# Patient Record
Sex: Female | Born: 1990 | Race: White | Hispanic: No | Marital: Single | Smoking: Never smoker
Health system: Southern US, Community
[De-identification: ages and names within clinical notes are randomized; demographics above are authoritative.]

## PROBLEM LIST (undated history)

## (undated) DIAGNOSIS — T7840XA Allergy, unspecified, initial encounter: Secondary | ICD-10-CM

## (undated) DIAGNOSIS — J45909 Unspecified asthma, uncomplicated: Secondary | ICD-10-CM

## (undated) HISTORY — PX: FRACTURE SURGERY: SHX138

## (undated) HISTORY — DX: Allergy, unspecified, initial encounter: T78.40XA

## (undated) HISTORY — PX: SHOULDER SURGERY: SHX246

## (undated) HISTORY — PX: WRIST SURGERY: SHX841

---
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
## 2011-04-14 ENCOUNTER — Ambulatory Visit: Payer: Self-pay | Admitting: Women's Health

## 2011-10-08 ENCOUNTER — Ambulatory Visit: Payer: BC Managed Care – PPO

## 2011-10-08 ENCOUNTER — Ambulatory Visit (INDEPENDENT_AMBULATORY_CARE_PROVIDER_SITE_OTHER): Payer: BC Managed Care – PPO | Admitting: Family Medicine

## 2011-10-08 VITALS — BP 113/72 | HR 60 | Temp 97.7°F | Resp 16 | Ht 71.0 in | Wt 198.8 lb

## 2011-10-08 DIAGNOSIS — M25579 Pain in unspecified ankle and joints of unspecified foot: Secondary | ICD-10-CM

## 2011-10-08 DIAGNOSIS — Z309 Encounter for contraceptive management, unspecified: Secondary | ICD-10-CM

## 2011-10-08 DIAGNOSIS — J45990 Exercise induced bronchospasm: Secondary | ICD-10-CM

## 2011-10-08 DIAGNOSIS — Z23 Encounter for immunization: Secondary | ICD-10-CM

## 2011-10-08 DIAGNOSIS — M25569 Pain in unspecified knee: Secondary | ICD-10-CM

## 2011-10-08 DIAGNOSIS — IMO0001 Reserved for inherently not codable concepts without codable children: Secondary | ICD-10-CM

## 2011-10-08 DIAGNOSIS — Z9189 Other specified personal risk factors, not elsewhere classified: Secondary | ICD-10-CM

## 2011-10-08 MED ORDER — ALBUTEROL SULFATE HFA 108 (90 BASE) MCG/ACT IN AERS: 2.0000 | INHALATION_SPRAY | Freq: Four times a day (QID) | RESPIRATORY_TRACT | Status: DC | PRN

## 2011-10-08 MED ORDER — NORGESTIMATE-ETH ESTRADIOL 0.25-35 MG-MCG PO TABS: 1.0000 | ORAL_TABLET | Freq: Every day | ORAL | Status: DC

## 2011-10-08 NOTE — Progress Notes (Signed)
Patient Name: Chelsey Moore Date of Birth: August 22, 1990 Medical Record Number: [id number] Gender: female Date of Encounter: 10/08/2011  History of Present Illness:  Chelsey Moore is a 21 y.o. very pleasant female patient who presents with the following few concerns:   She has been on Sprintec OCP, but her rx was changed to another brand due to insurance change.  She is to start her next pack tomorrow- had menses last week.  She really preferred the Sprintec and would like to go back on this. She would like to do 3 months rx at a time.    Needs a Tdap today as well.  She needs this for summer school at [employer].  She attends [employer] during the school year. Td done in 2006  Chelsey Moore would like to maybe get an inhaler for EIA. She notes that after doing sprints at volleyball practice she sometimes will wheeze.  These symptoms have been worse in the spring.  She does not otherwise have asthma or RAD.    She also has noted some tendonitis in her left knee, and has been diagnosed with loose patella and patella alta.  She had a steroid injection in the past at school, and has seen [facility] in the past as well.  She wears a knee sleeve by [facility].  She is working with her trainer at school and doing some rehab for her knee- and also for her ankle, see below.   Chelsey Moore severely sprained her right ankle about 2 months ago, and it has not yet gotten back to normal.  She is wearing a lace- up support.  The ankle is better but she is still not without pain.    Chelsey Moore plans to train hard all summer in order to be in shape for volleyball in the fall at school.    There is no problem list on file for this patient.  No past medical history on file. No past surgical history on file. History  Substance Use Topics  . Smoking status: Never Smoker   . Smokeless tobacco: Not on file  . Alcohol Use: Not on file   No family history on file. Allergies  Allergen Reactions  . Augmentin (Amoxicillin-Pot  Clavulanate) Diarrhea    Medication list has been reviewed and updated.  Prior to Admission medications   Medication Sig Start Date End Date Taking? Authorizing Provider  norgestimate-ethinyl estradiol (ORTHO-CYCLEN,SPRINTEC,PREVIFEM) 0.25-35 MG-MCG tablet Take 1 tablet by mouth daily.   Yes Historical Provider, MD    Review of Systems:  As per HPI- otherwise negative.   Physical Examination: Filed Vitals:   10/08/11 0950  BP: 113/72  Pulse: 60  Temp: 97.7 F (36.5 C)  Resp: 16   Filed Vitals:   10/08/11 0950  Height: 5\' 11"  (1.803 m)  Weight: 198 lb 12.8 oz (90.175 kg)   Body mass index is 27.73 kg/(m^2). Ideal Body Weight: Weight in (lb) to have BMI = 25: 178.9   GEN: WDWN, NAD, Non-toxic, A & O x 3 HEENT: Atraumatic, Normocephalic. Neck supple. No masses, No LAD. Ears and Nose: No external deformity. CV: RRR, No M/G/R. No JVD. No thrill. No extra heart sounds. PULM: CTA B, no wheezes, crackles, rhonchi. No retractions. No resp. distress. No accessory muscle use. EXTR: No c/c/e NEURO Normal gait.  PSYCH: Normally interactive. Conversant. Not depressed or anxious appearing.  Calm demeanor.  Right ankle:  Minimal tenderness over lateral malleolus, no bruising or swelling.  Normal ROM Left  knee: mild swelling, minimal if any effusion.  No crepitus, patellar ligament is somewhat lax but other ligaments stable.   [facility] reading (PRIMARY) by  Dr. Patsy Lager.  Negative left ankle  RIGHT ANKLE - COMPLETE 3+ VIEW  Comparison: No priors.  Findings: Three views the right ankle demonstrate no acute fracture, subluxation, dislocation, joint or soft tissue abnormality.  IMPRESSION: 1. No acute radiographic abnormality of the right ankle.  Assessment and Plan: 1. Scheduled immunizations not up to date  Tdap vaccine greater than or equal to 7yo IM  2. Pain, ankle  DG Ankle Complete Right  3. Pain, knee    4. Contraception  norgestimate-ethinyl estradiol (SPRINTEC 28) 0.25-35  MG-MCG tablet  5. Exercise-induced asthma  albuterol (PROVENTIL HFA;VENTOLIN HFA) 108 (90 BASE) MCG/ACT inhaler  gave rx for sprintec brand OCP which they plan to fill locally.  Update Tdap, gave albuterol inhaler for PRN use of EIA- discussed how and when to use it.    I am concerned that [name] is not going to give her knee and ankle the rest that they need to heal.  She is aware that ideally she would rest these joints until they are well, but she is not able to do this given her volleyball requirements.  I therefore recommended that they follow- up with [facility] who has helped them in the past.  A regimen of PT may be helpful in getting her through these injuries.  She plans to see a trusted doctor in [facility] [facility] first, but will see [facility] if she needs other help.    Abbe Amsterdam, MD

## 2011-10-17 ENCOUNTER — Ambulatory Visit (INDEPENDENT_AMBULATORY_CARE_PROVIDER_SITE_OTHER): Payer: BC Managed Care – PPO | Admitting: Family Medicine

## 2011-10-17 VITALS — BP 115/59 | HR 49 | Temp 98.5°F | Resp 16 | Ht 69.5 in | Wt 198.0 lb

## 2011-10-17 DIAGNOSIS — L03119 Cellulitis of unspecified part of limb: Secondary | ICD-10-CM

## 2011-10-17 DIAGNOSIS — L0390 Cellulitis, unspecified: Secondary | ICD-10-CM

## 2011-10-17 DIAGNOSIS — L02419 Cutaneous abscess of limb, unspecified: Secondary | ICD-10-CM

## 2011-10-17 MED ORDER — SULFAMETHOXAZOLE-TRIMETHOPRIM 800-160 MG PO TABS: 1.0000 | ORAL_TABLET | Freq: Two times a day (BID) | ORAL | Status: AC

## 2011-10-17 NOTE — Progress Notes (Signed)
Patient ID: NEDDA GAINS, female   DOB: August 08, 1990, 21 y.o.   MRN: [id number] TAVONNA WORTHINGTON is a 21 y.o. female who presents to Urgent Care today for redness and pain in Right inner thigh:  1.  Redness and pain:  Present x 3 days.  Notices when walking and playing volleyball.  No drainage.  No fevers or chills.  No nausea or vomiting.    Has had similar symptoms before and told it was MRSA infection and had it drained, but previously it was more painful.     PMH reviewed.  ROS as above otherwise neg Medications reviewed. Current Outpatient Prescriptions  Medication Sig Dispense Refill  . albuterol (PROVENTIL HFA;VENTOLIN HFA) 108 (90 BASE) MCG/ACT inhaler Inhale 2 puffs into the lungs every 6 (six) hours as needed for wheezing.  1 Inhaler  6  . norgestimate-ethinyl estradiol (SPRINTEC 28) 0.25-35 MG-MCG tablet Take 1 tablet by mouth daily.  3 Package  3    Exam:  BP 115/59  Pulse 49  Temp 98.5 F (36.9 C) (Oral)  Resp 16  Ht 5' 9.5" (1.765 m)  Wt 198 lb (89.812 kg)  BMI 28.82 kg/m2  LMP 10/08/2011 Gen: Well NAD Lungs: CTABL Nl WOB Heart: RRR no MRG Abd: NABS, NT, ND Exts: 1 x 2 cm area of cellulitis noted inner aspect of Right thigh.  No areas of fluctuance present.  There is an area of about 0.5 cm induration in very center of cellulitic skin.  Also small sinus present, unable to express any fluid when squeezed.    Nurse was present for all aspects of examination.  Assessment and Plan:  1.  Cellulitis:  Bactrim DS x 10 days.  No areas of fluctuance, nothing to drain at this time.  Instructed on warm compresses to bring to head so that we can drain it.  Return for recheck in 48 hours and hopeful to drain at that time, or possibly resolve with PO antibiotics.  FU sooner if she has worsening

## 2011-10-17 NOTE — Patient Instructions (Addendum)
Take the antibiotic twice daily for the next 10 days.   This should clear things up. If this doesn't get any better after 48 hours with the antibiotics, you should come in and see [place] at that point.   Use a warm compress several times a day for about 15 minutes at a time time to help bring this to a head. If you notice that the redness is spreading or you have any worsening of the pain, fevers, chills, vomiting come back soon.

## 2011-11-10 ENCOUNTER — Ambulatory Visit: Payer: BC Managed Care – PPO

## 2011-11-10 ENCOUNTER — Ambulatory Visit (INDEPENDENT_AMBULATORY_CARE_PROVIDER_SITE_OTHER): Payer: BC Managed Care – PPO | Admitting: Emergency Medicine

## 2011-11-10 VITALS — BP 118/74 | HR 62 | Temp 98.7°F | Resp 16 | Ht 70.5 in | Wt 195.0 lb

## 2011-11-10 DIAGNOSIS — S63509A Unspecified sprain of unspecified wrist, initial encounter: Secondary | ICD-10-CM

## 2011-11-10 DIAGNOSIS — M25539 Pain in unspecified wrist: Secondary | ICD-10-CM

## 2011-11-10 NOTE — Progress Notes (Signed)
  Date:  11/10/2011   Name:  WHITNI PASQUINI   DOB:  1991/03/13   MRN:  [id number]  PCP:  No primary provider on file.    Chief Complaint: Wrist Pain   History of Present Illness:  Chelsey Moore is a 21 y.o. very pleasant female patient who presents with the following:  Injured left wrist while playing volleyball last week.  Has pain in ulnar aspect of wrist.  Interferes with pronation and supination and flexion of wrist.  Denies other complaints  There is no problem list on file for this patient.   No past medical history on file.  No past surgical history on file.  History  Substance Use Topics  . Smoking status: Never Smoker   . Smokeless tobacco: Not on file  . Alcohol Use: Not on file    No family history on file.  Allergies  Allergen Reactions  . Augmentin (Amoxicillin-Pot Clavulanate) Diarrhea    Medication list has been reviewed and updated.  Current Outpatient Prescriptions on File Prior to Visit  Medication Sig Dispense Refill  . albuterol (PROVENTIL HFA;VENTOLIN HFA) 108 (90 BASE) MCG/ACT inhaler Inhale 2 puffs into the lungs every 6 (six) hours as needed for wheezing.  1 Inhaler  6  . norgestimate-ethinyl estradiol (SPRINTEC 28) 0.25-35 MG-MCG tablet Take 1 tablet by mouth daily.  3 Package  3    Review of Systems:  As per HPI, otherwise negative.   Physical Examination: Filed Vitals:   11/10/11 1748  BP: 118/74  Pulse: 62  Temp: 98.7 F (37.1 C)  Resp: 16   Filed Vitals:   11/10/11 1748  Height: 5' 10.5" (1.791 m)  Weight: 195 lb (88.451 kg)   Body mass index is 27.58 kg/(m^2). Ideal Body Weight: Weight in (lb) to have BMI = 25: 176.4    GEN: WDWN, NAD, Non-toxic, Alert & Oriented x 3 HEENT: Atraumatic, Normocephalic.  Ears and Nose: No external deformity. EXTR: No clubbing/cyanosis/edema NEURO: Normal gait.  PSYCH: Normally interactive. Conversant. Not depressed or anxious appearing.  Calm demeanor.  Wrist tender and guards  left wrist.  Assessment and Plan: Wrist sprain Splint Aleve Ice Rest  Carmelina Dane, MD

## 2011-11-10 NOTE — Progress Notes (Signed)
  Subjective:    Patient ID: Chelsey Moore, female    DOB: July 28, 1990, 21 y.o.   MRN: [id number]  HPI    Review of Systems     Objective:   Physical Exam        Assessment & Plan:  [facility] reading (PRIMARY) by  Dr. Dareen Piano.  Negative for new injury.  Evidence prior fracture.

## 2011-11-11 NOTE — Progress Notes (Signed)
  Subjective:    Patient ID: Chelsey Moore, female    DOB: Dec 07, 1990, 21 y.o.   MRN: [id number]  HPI    Review of Systems     Objective:   Physical Exam        Assessment & Plan:  [facility] reading (PRIMARY) by  Dr. Dareen Piano.  negative.

## 2012-04-20 ENCOUNTER — Encounter (HOSPITAL_BASED_OUTPATIENT_CLINIC_OR_DEPARTMENT_OTHER): Payer: Self-pay | Admitting: Emergency Medicine

## 2012-04-20 ENCOUNTER — Emergency Department (HOSPITAL_BASED_OUTPATIENT_CLINIC_OR_DEPARTMENT_OTHER)
Admission: EM | Admit: 2012-04-20 | Discharge: 2012-04-20 | Disposition: A | Discharge status: Home / Self Care | Payer: BC Managed Care – PPO | Attending: Emergency Medicine | Admitting: Emergency Medicine

## 2012-04-20 ENCOUNTER — Encounter (HOSPITAL_BASED_OUTPATIENT_CLINIC_OR_DEPARTMENT_OTHER): Payer: Self-pay | Admitting: *Deleted

## 2012-04-20 DIAGNOSIS — W64XXXA Exposure to other animate mechanical forces, initial encounter: Secondary | ICD-10-CM | POA: Insufficient documentation

## 2012-04-20 DIAGNOSIS — J45909 Unspecified asthma, uncomplicated: Secondary | ICD-10-CM | POA: Insufficient documentation

## 2012-04-20 DIAGNOSIS — Y929 Unspecified place or not applicable: Secondary | ICD-10-CM | POA: Insufficient documentation

## 2012-04-20 DIAGNOSIS — Z203 Contact with and (suspected) exposure to rabies: Secondary | ICD-10-CM | POA: Insufficient documentation

## 2012-04-20 DIAGNOSIS — Z79899 Other long term (current) drug therapy: Secondary | ICD-10-CM | POA: Insufficient documentation

## 2012-04-20 DIAGNOSIS — Z23 Encounter for immunization: Secondary | ICD-10-CM | POA: Insufficient documentation

## 2012-04-20 DIAGNOSIS — Y939 Activity, unspecified: Secondary | ICD-10-CM | POA: Insufficient documentation

## 2012-04-20 HISTORY — DX: Unspecified asthma, uncomplicated: J45.909

## 2012-04-20 MED ORDER — RABIES IMMUNE GLOBULIN 150 UNIT/ML IM INJ
INJECTION | INTRAMUSCULAR | Status: AC
  Administered 2012-04-20: 1800 [IU] via INTRAMUSCULAR
  Filled 2012-04-20: qty 4

## 2012-04-20 MED ORDER — RABIES IMMUNE GLOBULIN 150 UNIT/ML IM INJ
20.0000 [IU]/kg | INJECTION | Freq: Once | INTRAMUSCULAR | Status: AC
  Administered 2012-04-20: 1800 [IU] via INTRAMUSCULAR

## 2012-04-20 MED ORDER — RABIES VACCINE, PCEC IM SUSR
1.0000 mL | Freq: Once | INTRAMUSCULAR | Status: AC
  Administered 2012-04-20: 1 mL via INTRAMUSCULAR

## 2012-04-20 NOTE — ED Notes (Signed)
Exposure to a live bat found in their house.  No active bite or exposure noted.

## 2012-04-20 NOTE — ED Notes (Signed)
Possible bat bite, it was found in the house

## 2012-04-20 NOTE — ED Provider Notes (Addendum)
History     CSN: [id number]  Arrival date & time 04/20/12  1010   First MD Initiated Contact with Patient 04/20/12 1055      Chief Complaint  Patient presents with  . Rabies Injection    (Consider location/radiation/quality/duration/timing/severity/associated sxs/prior treatment) HPI Pt reports there was a bat in their house for several days earlier in the week. No known bites. Pt here with several family members to begin Rabies series. She is asymptomatic.  Past Medical History  Diagnosis Date  . Asthma     Past Surgical History  Procedure Date  . Wrist surgery     left    No family history on file.  History  Substance Use Topics  . Smoking status: Never Smoker   . Smokeless tobacco: Not on file  . Alcohol Use: Yes    OB History    Grav Para Term Preterm Abortions TAB SAB Ect Mult Living                  Review of Systems  All other systems reviewed and are negative.    Allergies  Augmentin  Home Medications   Current Outpatient Rx  Name  Route  Sig  Dispense  Refill  . ALBUTEROL SULFATE HFA 108 (90 BASE) MCG/ACT IN AERS   Inhalation   Inhale 2 puffs into the lungs every 6 (six) hours as needed for wheezing.   1 Inhaler   6   . NORGESTIMATE-ETH ESTRADIOL 0.25-35 MG-MCG PO TABS   Oral   Take 1 tablet by mouth daily.   3 Package   3     Patient prefers the Sprintec brand please     BP 128/71  Pulse 63  Temp 98.4 F (36.9 C) (Oral)  Resp 18  SpO2 100%  Physical Exam  Constitutional: She is oriented to person, place, and time. She appears well-developed and well-nourished.  HENT:  Head: Normocephalic and atraumatic.  Neck: Neck supple.  Pulmonary/Chest: Effort normal.  Neurological: She is alert and oriented to person, place, and time. No cranial nerve deficit.  Psychiatric: She has a normal mood and affect. Her behavior is normal.    ED Course  Procedures (including critical care time)  Labs Reviewed - No data to display No  results found.   No diagnosis found.    MDM  Rabies vaccination series ordered.      Zedekiah Hinderman B. Bernette Mayers, MD 04/20/12 1105

## 2012-04-23 ENCOUNTER — Encounter (HOSPITAL_COMMUNITY): Payer: Self-pay | Admitting: *Deleted

## 2012-04-23 ENCOUNTER — Emergency Department (INDEPENDENT_AMBULATORY_CARE_PROVIDER_SITE_OTHER)
Admission: EM | Admit: 2012-04-23 | Discharge: 2012-04-23 | Disposition: A | Discharge status: Home / Self Care | Payer: BC Managed Care – PPO | Source: Home / Self Care

## 2012-04-23 DIAGNOSIS — Z23 Encounter for immunization: Secondary | ICD-10-CM

## 2012-04-23 MED ORDER — RABIES VACCINE, PCEC IM SUSR
1.0000 mL | Freq: Once | INTRAMUSCULAR | Status: AC
  Administered 2012-04-23: 1 mL via INTRAMUSCULAR

## 2012-04-23 MED ORDER — RABIES VACCINE, PCEC IM SUSR
INTRAMUSCULAR | Status: AC
  Filled 2012-04-23: qty 1

## 2012-04-23 NOTE — ED Notes (Signed)
Pt  Here  Today  For  Day 3  Rabies

## 2012-04-27 ENCOUNTER — Emergency Department (INDEPENDENT_AMBULATORY_CARE_PROVIDER_SITE_OTHER)
Admission: EM | Admit: 2012-04-27 | Discharge: 2012-04-27 | Disposition: A | Discharge status: Home / Self Care | Payer: BC Managed Care – PPO | Source: Home / Self Care

## 2012-04-27 ENCOUNTER — Encounter (HOSPITAL_COMMUNITY): Payer: Self-pay | Admitting: *Deleted

## 2012-04-27 DIAGNOSIS — Z23 Encounter for immunization: Secondary | ICD-10-CM

## 2012-04-27 MED ORDER — RABIES VACCINE, PCEC IM SUSR
INTRAMUSCULAR | Status: AC
  Filled 2012-04-27: qty 1

## 2012-04-27 MED ORDER — RABIES VACCINE, PCEC IM SUSR
1.0000 mL | Freq: Once | INTRAMUSCULAR | Status: AC
  Administered 2012-04-27: 1 mL via INTRAMUSCULAR

## 2012-04-27 NOTE — ED Notes (Signed)
Presents for rabies injection. 

## 2012-05-04 ENCOUNTER — Emergency Department (INDEPENDENT_AMBULATORY_CARE_PROVIDER_SITE_OTHER)
Admission: EM | Admit: 2012-05-04 | Discharge: 2012-05-04 | Disposition: A | Discharge status: Home / Self Care | Payer: BC Managed Care – PPO | Source: Home / Self Care

## 2012-05-04 ENCOUNTER — Encounter (HOSPITAL_COMMUNITY): Payer: Self-pay | Admitting: Emergency Medicine

## 2012-05-04 DIAGNOSIS — Z203 Contact with and (suspected) exposure to rabies: Secondary | ICD-10-CM

## 2012-05-04 MED ORDER — RABIES VACCINE, PCEC IM SUSR
1.0000 mL | Freq: Once | INTRAMUSCULAR | Status: AC
  Administered 2012-05-04: 1 mL via INTRAMUSCULAR

## 2012-05-04 MED ORDER — RABIES VACCINE, PCEC IM SUSR
INTRAMUSCULAR | Status: AC
  Filled 2012-05-04: qty 1

## 2012-05-04 NOTE — ED Notes (Signed)
Here for the 4th rabies vacc 

## 2012-08-03 ENCOUNTER — Other Ambulatory Visit: Payer: Self-pay | Admitting: Family Medicine

## 2012-11-16 ENCOUNTER — Other Ambulatory Visit: Payer: Self-pay | Admitting: Physician Assistant

## 2013-03-20 ENCOUNTER — Ambulatory Visit: Payer: Managed Care, Other (non HMO) | Admitting: Emergency Medicine

## 2013-03-20 VITALS — BP 112/74 | HR 72 | Temp 98.5°F | Resp 16 | Ht 70.5 in | Wt 191.8 lb

## 2013-03-20 DIAGNOSIS — R05 Cough: Secondary | ICD-10-CM

## 2013-03-20 DIAGNOSIS — J018 Other acute sinusitis: Secondary | ICD-10-CM

## 2013-03-20 DIAGNOSIS — J209 Acute bronchitis, unspecified: Secondary | ICD-10-CM

## 2013-03-20 MED ORDER — PSEUDOEPHEDRINE-GUAIFENESIN ER 60-600 MG PO TB12: 1.0000 | ORAL_TABLET | Freq: Two times a day (BID) | ORAL | Status: DC

## 2013-03-20 MED ORDER — HYDROCOD POLST-CHLORPHEN POLST 10-8 MG/5ML PO LQCR: 5.0000 mL | Freq: Two times a day (BID) | ORAL | Status: DC | PRN

## 2013-03-20 MED ORDER — LEVOFLOXACIN 500 MG PO TABS: 500.0000 mg | ORAL_TABLET | Freq: Every day | ORAL | Status: AC

## 2013-03-20 NOTE — Progress Notes (Signed)
[facility] [address], [place] [address] [phone]  Date:  03/20/2013   Name:  Chelsey Moore   DOB:  09/19/1990   MRN:  [id number]  PCP:  No PCP Per Patient    Chief Complaint: Cough and Sinus Congestion   History of Present Illness:  Chelsey Moore is a 22 y.o. very pleasant female patient who presents with the following:  Ill for two weeks with worsening cough and purulent nasal drainage and post nasal drip.  No fever or chills.  No nausea or vomiting.  No wheezing or shortness of breath.  No improvement with over the counter medications or other home remedies. Denies other complaint or health concern today.   There are no active problems to display for this patient.   Past Medical History  Diagnosis Date  . Asthma   . Allergy     Past Surgical History  Procedure Laterality Date  . Wrist surgery      left  . Fracture surgery      History  Substance Use Topics  . Smoking status: Never Smoker   . Smokeless tobacco: Not on file  . Alcohol Use: Yes    Family History  Problem Relation Age of Onset  . Cancer Mother   . Cancer Maternal Grandmother   . Cancer Paternal Grandmother   . Cancer Paternal Grandfather     Allergies  Allergen Reactions  . Augmentin [Amoxicillin-Pot Clavulanate] Diarrhea    Medication list has been reviewed and updated.  Current Outpatient Prescriptions on File Prior to Visit  Medication Sig Dispense Refill  . norgestimate-ethinyl estradiol (ORTHO-CYCLEN,SPRINTEC,PREVIFEM) 0.25-35 MG-MCG tablet Take 1 tablet by mouth daily. Needs office visit  28 tablet  0  . albuterol (PROVENTIL HFA;VENTOLIN HFA) 108 (90 BASE) MCG/ACT inhaler Inhale 2 puffs into the lungs every 6 (six) hours as needed for wheezing.  1 Inhaler  6   No current facility-administered medications on file prior to visit.    Review of Systems:  As per HPI, otherwise negative.    Physical Examination: Filed Vitals:   03/20/13  0843  BP: 112/74  Pulse: 72  Temp: 98.5 F (36.9 C)  Resp: 16   Filed Vitals:   03/20/13 0843  Height: 5' 10.5" (1.791 m)  Weight: 191 lb 12.8 oz (87 kg)   Body mass index is 27.12 kg/(m^2). Ideal Body Weight: Weight in (lb) to have BMI = 25: 176.4  GEN: WDWN, NAD, Non-toxic, A & O x 3 HEENT: Atraumatic, Normocephalic. Neck supple. No masses, No LAD. Ears and Nose: No external deformity. CV: RRR, No M/G/R. No JVD. No thrill. No extra heart sounds. PULM: CTA B, no wheezes, crackles, rhonchi. No retractions. No resp. distress. No accessory muscle use. ABD: S, NT, ND, +BS. No rebound. No HSM. EXTR: No c/c/e NEURO Normal gait.  PSYCH: Normally interactive. Conversant. Not depressed or anxious appearing.  Calm demeanor.    Assessment and Plan: Sinusitis Bronchitis levaquin mucinex d tussionex   Signed,  Phillips Odor, MD

## 2013-03-20 NOTE — Patient Instructions (Signed)

## 2014-02-10 ENCOUNTER — Ambulatory Visit (INDEPENDENT_AMBULATORY_CARE_PROVIDER_SITE_OTHER): Payer: Managed Care, Other (non HMO) | Admitting: Physician Assistant

## 2014-02-10 VITALS — BP 120/82 | HR 58 | Temp 98.5°F | Resp 16 | Ht 71.0 in | Wt 200.0 lb

## 2014-02-10 DIAGNOSIS — L989 Disorder of the skin and subcutaneous tissue, unspecified: Secondary | ICD-10-CM

## 2014-02-10 DIAGNOSIS — F411 Generalized anxiety disorder: Secondary | ICD-10-CM

## 2014-02-10 DIAGNOSIS — J45990 Exercise induced bronchospasm: Secondary | ICD-10-CM

## 2014-02-10 MED ORDER — LEVALBUTEROL TARTRATE 45 MCG/ACT IN AERO: 2.0000 | INHALATION_SPRAY | RESPIRATORY_TRACT | Status: AC | PRN

## 2014-02-10 NOTE — Progress Notes (Signed)
I was directly involved with the patient's care and agree with the physical, diagnosis and treatment plan and actively participated during the procedure.  

## 2014-02-10 NOTE — Progress Notes (Signed)
Subjective:    Patient ID: Chelsey Moore, female    DOB: 03/22/1991, 23 y.o.   MRN: [id number]  HPI Patient presents to clinic for mole, psych referral, and med refill. Has noticed mole under right arm for past 2 weeks when shaving makes it more irritated. Does not itch and is not painful. Does not recall if it has changed shape or color. Would like it removed.  Recently graduated from [employer] and was being seen by school psychiatrist for anxiety and acommodations. Did not get referral for anxiety and possible ADD due to possibility of going to med school with scholarship from [employer]. Now that she is not trying to get military to pay for school and is applying to physical therapy school she would like further evaluation and needs a referral. Describes her mood as stable and thinks she is happy most of the time, however, with working a rehab facility, filling out school applications, taking online classes, and moving to a new city her stress and anxiety level have increased over the past few months.   Has exercised induced asthma which was diagnosed 4 years ago when she started volleyball. Needs refill of Xopenex.    Review of Systems  Constitutional: Negative for fatigue.  Respiratory: Negative for cough, shortness of breath and wheezing.   Cardiovascular: Negative for chest pain and palpitations.  Gastrointestinal: Negative for abdominal pain.  Skin: Negative for color change, pallor, rash and wound.       Mole for 2 weeks  Neurological: Negative for dizziness, light-headedness and headaches.  Hematological: Negative for adenopathy. Does not bruise/bleed easily.  Psychiatric/Behavioral: Negative for behavioral problems, confusion, sleep disturbance, dysphoric mood and agitation. The patient is nervous/anxious.        Objective:   Physical Exam  Constitutional: She is oriented to person, place, and time. She appears well-developed and well-nourished. No distress.  Blood  pressure 120/82, pulse 58, temperature 98.5 F (36.9 C), temperature source Oral, resp. rate 16, height 5\' 11"  (1.803 m), weight 200 lb (90.719 kg), last menstrual period 02/02/2014, SpO2 100.00%.   HENT:  Head: Normocephalic and atraumatic.  Right Ear: External ear normal.  Left Ear: External ear normal.  Mouth/Throat: Oropharynx is clear and moist.  Eyes: Conjunctivae are normal. Pupils are equal, round, and reactive to light. Right eye exhibits no discharge. Left eye exhibits no discharge.  Neck: Neck supple.  Cardiovascular: Normal rate, regular rhythm and normal heart sounds.  Exam reveals no gallop and no friction rub.   No murmur heard. Pulmonary/Chest: Effort normal and breath sounds normal. She has no wheezes. She has no rales.  Abdominal: Soft. Bowel sounds are normal. There is no tenderness.  Lymphadenopathy:    She has no cervical adenopathy.       Left axillary: No pectoral and no lateral adenopathy present. Neurological: She is alert and oriented to person, place, and time.  Skin: Skin is warm and dry. No rash noted. She is not diaphoretic. No erythema. No pallor.  Brown circular raised lesion resembling a mole. Not erythematous or on indurated.   Psychiatric: She has a normal mood and affect. Her behavior is normal. Judgment and thought content normal.   Procedure Consent obtained. Local anesthesia 1cc 1% lidocaine. Iodine to cleanse. 3mm punch biopsy. Dressing applied. Pathology sent.     Assessment & Plan:  1. Skin lesion - Dermatology pathology  2. Anxiety state - Ambulatory referral to Psychiatry  3. Asthma, exercise induced - Xopenex refilled.  Chelsey Ridgeishira Lyden Redner PA-C  [facility] 02/10/2014 7:39 PM

## 2014-02-13 ENCOUNTER — Encounter: Payer: Self-pay | Admitting: Physician Assistant

## 2014-05-07 ENCOUNTER — Telehealth: Payer: Self-pay

## 2014-05-07 NOTE — Telephone Encounter (Signed)
Pt has new insurance through her work and is requesting that we [facility]

## 2014-05-07 NOTE — Telephone Encounter (Signed)
Changed pharmacy

## 2014-06-02 ENCOUNTER — Telehealth: Payer: Self-pay

## 2014-06-02 NOTE — Telephone Encounter (Signed)
She states she had a physical at her school in March 2015. I advised her she may have to call them to ask for a refill. She states she no longer attend the school so she doesn't think they will refill. Can we help her with this? I warned her that she would have to have a physical from us.

## 2014-06-02 NOTE — Telephone Encounter (Addendum)
Pt states she was denied her medication without an office visit but she just had one. It is her SPRINTEC 0.25-35 MGS. Please call [phone]    [facility] MAIL [facility]

## 2014-06-03 MED ORDER — NORGESTIMATE-ETH ESTRADIOL 0.25-35 MG-MCG PO TABS: 1.0000 | ORAL_TABLET | Freq: Every day | ORAL | Status: DC

## 2014-06-03 NOTE — Telephone Encounter (Signed)
Meds ordered this encounter  Medications  . norgestimate-ethinyl estradiol (ORTHO-CYCLEN,SPRINTEC,PREVIFEM) 0.25-35 MG-MCG tablet    Sig: Take 1 tablet by mouth daily. Needs office visit    Dispense:  28 tablet    Refill:  12    Order Specific Question:  Supervising Provider    Answer:  Merla RichesOLITTLE, ROBERT P [[id number]]

## 2014-06-04 ENCOUNTER — Ambulatory Visit (INDEPENDENT_AMBULATORY_CARE_PROVIDER_SITE_OTHER): Payer: BLUE CROSS/BLUE SHIELD | Admitting: Family Medicine

## 2014-06-04 ENCOUNTER — Ambulatory Visit (INDEPENDENT_AMBULATORY_CARE_PROVIDER_SITE_OTHER): Payer: BLUE CROSS/BLUE SHIELD

## 2014-06-04 VITALS — BP 120/72 | HR 64 | Temp 98.3°F | Resp 16 | Ht 69.75 in | Wt 189.6 lb

## 2014-06-04 DIAGNOSIS — R0781 Pleurodynia: Secondary | ICD-10-CM

## 2014-06-04 DIAGNOSIS — R0602 Shortness of breath: Secondary | ICD-10-CM

## 2014-06-04 DIAGNOSIS — R079 Chest pain, unspecified: Secondary | ICD-10-CM

## 2014-06-04 DIAGNOSIS — S66802S Unspecified injury of other specified muscles, fascia and tendons at wrist and hand level, left hand, sequela: Secondary | ICD-10-CM

## 2014-06-04 DIAGNOSIS — S6982XS Other specified injuries of left wrist, hand and finger(s), sequela: Secondary | ICD-10-CM

## 2014-06-04 DIAGNOSIS — M25532 Pain in left wrist: Secondary | ICD-10-CM

## 2014-06-04 DIAGNOSIS — Z3041 Encounter for surveillance of contraceptive pills: Secondary | ICD-10-CM

## 2014-06-04 LAB — POCT CBC
GRANULOCYTE PERCENT: 72.4 % (ref 37–80)
HCT, POC: 43.2 % (ref 37.7–47.9)
HEMOGLOBIN: 14 g/dL (ref 12.2–16.2)
Lymph, poc: 3.1 (ref 0.6–3.4)
MCH: 29.8 pg (ref 27–31.2)
MCHC: 32.5 g/dL (ref 31.8–35.4)
MCV: 91.5 fL (ref 80–97)
MID (CBC): 0.5 (ref 0–0.9)
MPV: 8.3 fL (ref 0–99.8)
PLATELET COUNT, POC: 200 10*3/uL (ref 142–424)
POC GRANULOCYTE: 9.3 — AB (ref 2–6.9)
POC LYMPH PERCENT: 23.8 %L (ref 10–50)
POC MID %: 3.8 % (ref 0–12)
RBC: 4.72 M/uL (ref 4.04–5.48)
RDW, POC: 13.7 %
WBC: 12.9 10*3/uL — AB (ref 4.6–10.2)

## 2014-06-04 LAB — POCT SEDIMENTATION RATE: POCT SED RATE: 10 mm/h (ref 0–22)

## 2014-06-04 MED ORDER — NORGESTIMATE-ETH ESTRADIOL 0.25-35 MG-MCG PO TABS: 1.0000 | ORAL_TABLET | Freq: Every day | ORAL | Status: AC

## 2014-06-04 MED ORDER — MELOXICAM 15 MG PO TABS: 15.0000 mg | ORAL_TABLET | Freq: Every day | ORAL | Status: DC

## 2014-06-04 MED ORDER — NORGESTIMATE-ETH ESTRADIOL 0.25-35 MG-MCG PO TABS: 1.0000 | ORAL_TABLET | Freq: Every day | ORAL | Status: DC

## 2014-06-04 NOTE — Progress Notes (Addendum)
Subjective:  This chart was scribed for Norberto Sorenson, MD by Charline Bills, ED Scribe. The patient was seen in room 12. Patient's care was started at 3:43 PM.   Patient ID: Chelsey Moore, female    DOB: 1991-03-25, 24 y.o.   MRN: [id number]  Chief Complaint  Patient presents with  . Sharp pain in chest and back    Onset last night  . Medication Refill   HPI HPI Comments: Chelsey Moore is a 24 y.o. female, with a h/o asthma, who presents to the [facility] in the company of her grandmother, complaining of sudden onset of chest pain last night. Pt describes chest pain as a sharp sensation that is exacerbated with swallowing, deep breathing and coughing. She reports associated difficulty breathing and difficulty swallowing. Pt reports 1 episode of similar pain 2 weeks prior. She also reports intermittent back pain that she describes as sharp in quality since last night. She states that chest pain and back pain do not occur simultaneously. Pt further reports chest palpitations a few weeks ago while doing HIIT workouts that lasted for approximately 30 minutes after exercise stopped. She reports experiencing SOB and wheezing at that time that resolved with Xophenex use. Pt typically takes her inhaler prior to exercising. She denies recent illness, indigestion, heartburn, abdominal pain, cough, SOB, palpitations at this time. She also denies tobacco use. Pt is not currently taking any OTC medications. No sick contacts. No family h/o cardiac related or pulmonary related complications.   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 She states that she was able to play collegiate volleyball and has not had any pain since. However, pt reports re-injury to L wrist 1 month ago when she was boxing. She took time off from boxing but has noticed intermittent joint swelling and tenderness since re-injury.    GU Pt's last pap smear was last year at [facility]; normal. She denies h/o abnormal pap smears. Pt reports that both her paternal and maternal grandmothers have a h/o breast CA.  Her paternal grandmother was diagnosed at age 61. Pt's provider noticed a lump in her R breast last year. She was advised to keep an eye on the lump but admits to not doing self check breast exams at home. Pt's mother has a h/o cervical CA. Pt has received the Gardasil vaccine series.   Medication Refill Pt requests a refill of Sprintec at this time. She states that she has been out of [facility] for 4 days. She was supposed to start a new pack 4 days ago. Pt denies current sexual activity.   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  Past Medical History  Diagnosis Date  . Asthma   . Allergy    Current Outpatient Prescriptions on File Prior to Visit  Medication Sig Dispense Refill  . levalbuterol (XOPENEX HFA) 45 MCG/ACT inhaler Inhale 2 puffs into the lungs every 4 (four) hours as needed for wheezing (30 min prior to exercise). 1 Inhaler 12  . norgestimate-ethinyl estradiol (ORTHO-CYCLEN,SPRINTEC,PREVIFEM) 0.25-35 MG-MCG tablet Take 1 tablet by mouth daily. Needs office visit 28 tablet 12   No current facility-administered medications on file prior to visit.   Allergies  Allergen Reactions  . Augmentin [Amoxicillin-Pot Clavulanate] Diarrhea   Review of Systems  Respiratory: Negative for cough and shortness of breath.   Cardiovascular: Positive for  chest pain. Negative for palpitations.  Gastrointestinal: Negative for abdominal pain.  Musculoskeletal: Positive for back pain, joint swelling and arthralgias.   BP 120/72 mmHg  Pulse 64  Temp(Src) 98.3 F (36.8 C) (Oral)  Resp 16  Ht 5' 9.75" (1.772 m)  Wt 189 lb 9.6 oz (86.002 kg)  BMI 27.39 kg/m2  SpO2 99%  LMP 05/25/2014    Objective:   Physical Exam  Constitutional: She is oriented to person, place, and time. She  appears well-developed and well-nourished. No distress.  HENT:  Head: Normocephalic and atraumatic.  Eyes: Conjunctivae and EOM are normal.  Neck: Neck supple. No tracheal deviation present.  Cardiovascular: Normal rate, regular rhythm, S1 normal, S2 normal and normal heart sounds.  Exam reveals no gallop and no friction rub.   No murmur heard. Pulmonary/Chest: Effort normal and breath sounds normal. No respiratory distress. Right breast exhibits no mass. Left breast exhibits no mass.  Lungs are clear to auscultation bilaterally. No tenderness to palpation to posterior ribs.   Musculoskeletal: Normal range of motion.  Tenderness to ulnar aspect of L wrist, distal to ulna. Full pronation and supination. Lateral flexion towards radial aspect is increasing over 45 degrees.  Neurological: She is alert and oriented to person, place, and time.  Skin: Skin is warm and dry.  Psychiatric: She has a normal mood and affect. Her behavior is normal.  Nursing note and vitals reviewed.  EKG: Normal sinus rhythm. No ischemic changes or signs of strain     [facility] reading (PRIMARY) by  Dr. Clelia CroftShaw. CXR: normal. No cause of pleuritic right sided chest pain seen. Left wrist - h/o prior ulnar avulsion fracture requiring external fixation 9 yrs prev seen without significant change from prior seen  Dg Chest 2 View  06/04/2014   CLINICAL DATA:  Sudden onset of chest pain last night. Pt describes chest pain as a sharp sensation that is exacerbated with swallowing, deep breathing and coughing. She reports associated difficulty breathing and difficulty swallowing. Pt reports 1 episode of similar pain 2 weeks prior. She also reports intermittent back pain that she describes as sharp in quality since last night. She states that chest pain and back pain do not occur simultaneously  EXAM: CHEST  2 VIEW  COMPARISON:  None.  FINDINGS: The heart size and mediastinal contours are within normal limits. Both lungs are clear. No  pneumothorax or pleural effusion. The visualized skeletal structures are unremarkable.  IMPRESSION: No active cardiopulmonary disease.   Electronically Signed   By: Amie Portlandavid  Ormond M.D.   On: 06/04/2014 17:27   Dg Wrist Complete Left  06/04/2014   CLINICAL DATA:  24 year old female with remote injury to the left wrist rib crying surgery. Re- injury 1 month ago with pain and swelling. Initial encounter.  EXAM: LEFT WRIST - COMPLETE 3+ VIEW  COMPARISON:  11/10/2011.  FINDINGS: Stable chronic changes to the distal ulna and ulnar styloid. No superimposed acute ulna fracture identified. 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 Distal left radius intact. There is radiocarpal joint space loss with radius subchondral sclerosis. Carpal bone alignment within normal limits. Carpal joint spaces preserved. Visible metacarpals intact.  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 No acute osseous abnormality identified.   Electronically Signed   By: Odessa FlemingH  Hall M.D.   On: 06/04/2014 17:28    Assessment & Plan:   Chest pain, unspecified chest pain type - Plan: EKG 12-Lead, DG Chest 2 View - unsure of  etiology but suspect costochondritis vs pleurisy so start daily anti-inflammatory meloxicam.  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  PE unlikely as EKG looks great but will proceed w/ d-dimer since she has had several isolated episodes of SHoB and palpitatoins over the past sev wks and is on OCPs. If ddimer stat is + -> go to ER for CTA.  Pleuritic chest pain - Plan: DG Chest 2 View, POCT CBC, POCT SEDIMENTATION RATE, D-dimer, quantitative, CANCELED: D-dimer, quantitative  Shortness of breath - Plan: DG Chest 2 View, POCT CBC, POCT SEDIMENTATION RATE, D-dimer, quantitative, CANCELED: D-dimer, quantitative  Left wrist pain - Plan: DG Wrist Complete Left - Now with more pain, swelling and increased  laxity with lateral flexion to radial side of wrist where pt had so will have pt f/u w/ hand surgeon - Dr. Junie Bame for further eval.  Family planning - refilled sprintec to mail in pharmacy - clarified 3 month supply on phone directly to mail order pharmacy while pt is in office. Rec pap for additional refills in 1 yr.    Meds ordered this encounter  Medications  . sertraline (ZOLOFT) 25 MG tablet    Sig: Take 25 mg by mouth daily.  . meloxicam (MOBIC) 15 MG tablet    Sig: Take 1 tablet (15 mg total) by mouth daily.    Dispense:  30 tablet    Refill:  0  . DISCONTD: norgestimate-ethinyl estradiol (ORTHO-CYCLEN,SPRINTEC,PREVIFEM) 0.25-35 MG-MCG tablet    Sig: Take 1 tablet by mouth daily. Needs office visit    Dispense:  84 tablet    Refill:  4  . norgestimate-ethinyl estradiol (ORTHO-CYCLEN,SPRINTEC,PREVIFEM) 0.25-35 MG-MCG tablet    Sig: Take 1 tablet by mouth daily. Needs office visit    Dispense:  84 tablet    Refill:  4     I personally performed the services described in this documentation, which was scribed in my presence. The recorded information has been reviewed and considered, and addended by me as needed.  Norberto Sorenson, MD MPH

## 2014-06-04 NOTE — Patient Instructions (Signed)
Pleurisy Pleurisy is an inflammation and swelling of the lining of the lungs (pleura). Because of this inflammation, it hurts to breathe. It can be aggravated by coughing, laughing, or deep breathing. Pleurisy is often caused by an underlying infection or disease.  HOME CARE INSTRUCTIONS  Monitor your pleurisy for any changes. The following actions may help to alleviate any discomfort you are experiencing:  Medicine may help with pain. Only take over-the-counter or prescription medicines for pain, discomfort, or fever as directed by your health care provider.  Only take antibiotic medicine as directed. Make sure to finish it even if you start to feel better. SEEK MEDICAL CARE IF:   Your pain is not controlled with medicine or is increasing.  You have an increase in pus-like (purulent) secretions brought up with coughing. SEEK IMMEDIATE MEDICAL CARE IF:   You have blue or dark lips, fingernails, or toenails.  You are coughing up blood.  You have increased difficulty breathing.  You have continuing pain unrelieved by medicine or pain lasting more than 1 week.  You have pain that radiates into your neck, arms, or jaw.  You develop increased shortness of breath or wheezing.  You develop a fever, rash, vomiting, fainting, or other serious symptoms. MAKE SURE YOU:  Understand these instructions.   Will watch your condition.   Will get help right away if you are not doing well or get worse.  Document Released: 04/10/2005 Document Revised: 12/11/2012 Document Reviewed: 09/22/2012 ExitCare Patient Information 2015 [employer]. This information is not intended to replace advice given to you by your health care provider. Make sure you discuss any questions you have with your health care provider. Costochondritis Costochondritis, sometimes called Tietze syndrome, is a swelling and irritation (inflammation) of the tissue (cartilage) that connects your ribs with your breastbone  (sternum). It causes pain in the chest and rib area. Costochondritis usually goes away on its own over time. It can take up to 6 weeks or longer to get better, especially if you are unable to limit your activities. CAUSES  Some cases of costochondritis have no known cause. Possible causes include:  Injury (trauma).  Exercise or activity such as lifting.  Severe coughing. SIGNS AND SYMPTOMS  Pain and tenderness in the chest and rib area.  Pain that gets worse when coughing or taking deep breaths.  Pain that gets worse with specific movements. DIAGNOSIS  Your health care provider will do a physical exam and ask about your symptoms. Chest X-rays or other tests may be done to rule out other problems. TREATMENT  Costochondritis usually goes away on its own over time. Your health care provider may prescribe medicine to help relieve pain. HOME CARE INSTRUCTIONS   Avoid exhausting physical activity. Try not to strain your ribs during normal activity. This would include any activities using chest, abdominal, and side muscles, especially if heavy weights are used.  Apply ice to the affected area for the first 2 days after the pain begins.  Put ice in a plastic bag.  Place a towel between your skin and the bag.  Leave the ice on for 20 minutes, 2-3 times a day.  Only take over-the-counter or prescription medicines as directed by your health care provider. SEEK MEDICAL CARE IF:  You have redness or swelling at the rib joints. These are signs of infection.  Your pain does not go away despite rest or medicine. SEEK IMMEDIATE MEDICAL CARE IF:   Your pain increases or you are very uncomfortable.    You have shortness of breath or difficulty breathing.  You cough up blood.  You have worse chest pains, sweating, or vomiting.  You have a fever or persistent symptoms for more than 2-3 days.  You have a fever and your symptoms suddenly get worse. MAKE SURE YOU:   Understand these  instructions.  Will watch your condition.  Will get help right away if you are not doing well or get worse. Document Released: 01/18/2005 Document Revised: 01/29/2013 Document Reviewed: 11/12/2012 ExitCare Patient Information 2015 [employer]. This information is not intended to replace advice given to you by your health care provider. Make sure you discuss any questions you have with your health care provider.  

## 2014-06-05 LAB — D-DIMER, QUANTITATIVE: D-Dimer, Quant: 0.35 ug/mL-FEU (ref 0.00–0.48)

## 2014-06-09 NOTE — Telephone Encounter (Signed)
Pt came in for check up on 06/04/14.

## 2014-06-11 ENCOUNTER — Ambulatory Visit (INDEPENDENT_AMBULATORY_CARE_PROVIDER_SITE_OTHER): Payer: BLUE CROSS/BLUE SHIELD | Admitting: Family Medicine

## 2014-06-11 VITALS — BP 118/70 | HR 54 | Temp 97.7°F | Resp 16 | Ht 69.75 in | Wt 190.8 lb

## 2014-06-11 DIAGNOSIS — R0789 Other chest pain: Secondary | ICD-10-CM

## 2014-06-11 DIAGNOSIS — D72829 Elevated white blood cell count, unspecified: Secondary | ICD-10-CM

## 2014-06-11 DIAGNOSIS — R110 Nausea: Secondary | ICD-10-CM

## 2014-06-11 LAB — POCT CBC
Granulocyte percent: 66 %G (ref 37–80)
HCT, POC: 41.5 % (ref 37.7–47.9)
Hemoglobin: 13.4 g/dL (ref 12.2–16.2)
LYMPH, POC: 2.8 (ref 0.6–3.4)
MCH, POC: 29.4 pg (ref 27–31.2)
MCHC: 32.3 g/dL (ref 31.8–35.4)
MCV: 90.9 fL (ref 80–97)
MID (cbc): 0.5 (ref 0–0.9)
MPV: 7.6 fL (ref 0–99.8)
POC GRANULOCYTE: 6.4 (ref 2–6.9)
POC LYMPH %: 28.8 % (ref 10–50)
POC MID %: 5.2 % (ref 0–12)
Platelet Count, POC: 190 10*3/uL (ref 142–424)
RBC: 4.57 M/uL (ref 4.04–5.48)
RDW, POC: 13.2 %
WBC: 9.7 10*3/uL (ref 4.6–10.2)

## 2014-06-11 LAB — COMPREHENSIVE METABOLIC PANEL
ALT: 14 U/L (ref 0–35)
AST: 14 U/L (ref 0–37)
Albumin: 3.8 g/dL (ref 3.5–5.2)
Alkaline Phosphatase: 47 U/L (ref 39–117)
BILIRUBIN TOTAL: 0.5 mg/dL (ref 0.2–1.2)
BUN: 14 mg/dL (ref 6–23)
CALCIUM: 9 mg/dL (ref 8.4–10.5)
CHLORIDE: 104 meq/L (ref 96–112)
CO2: 26 meq/L (ref 19–32)
CREATININE: 0.84 mg/dL (ref 0.50–1.10)
GLUCOSE: 88 mg/dL (ref 70–99)
POTASSIUM: 4.3 meq/L (ref 3.5–5.3)
SODIUM: 139 meq/L (ref 135–145)
Total Protein: 6.7 g/dL (ref 6.0–8.3)

## 2014-06-11 MED ORDER — MELOXICAM 15 MG PO TABS: 15.0000 mg | ORAL_TABLET | Freq: Every day | ORAL | Status: AC

## 2014-06-11 NOTE — Progress Notes (Signed)
This chart was scribed for Norberto SorensonEva Taeler Winning, MD by Milly JakobJohn Lee Graves, ED Scribe. The patient was seen in room 1. Patient's care was started at 4:37 PM.  Subjective:   Patient ID: Chelsey Borneachel M Moore, female    DOB: 10/28/1990, 24 y.o.   MRN: [id number]  HPI  HPI Comments: Chelsey Moore is a 24 y.o. female who presents to the [facility] for a follow up of her chest pain and left wrist. She is here with her father today who is a Teacher, early years/prepharmacist [place].   She states that her wrist has been feeling better, and she has not been working out for this past week to avoid pain flareups. She reports taking Mobic with relief and taking Ortho-Cyclen as prescribed. She states that she has a brace at home from a previous injury. She denies any changes in her wrist since taking Mobic, but she denies pain at work. She denies any heart palpitations or SOB like she reported at her last visit. She reports intermittent, central, chest pain at night, but not as bad as she previously described. She denies chest pain upon deep breathing, but states that it is slightly exacerbated by palpation. She expresses concern that she may have gallbladder infection symptoms. She reports nausea along with her chest pain and a new sleep disturbance consisting of waking up at 3:30 AM. She states that she has some mild discomfort about 1-2 hours after she eats. She denies diarrhea.   Past Medical History  Diagnosis Date  . Asthma   . Allergy    Current Outpatient Prescriptions on File Prior to Visit  Medication Sig Dispense Refill  . levalbuterol (XOPENEX HFA) 45 MCG/ACT inhaler Inhale 2 puffs into the lungs every 4 (four) hours as needed for wheezing (30 min prior to exercise). 1 Inhaler 12  . meloxicam (MOBIC) 15 MG tablet Take 1 tablet (15 mg total) by mouth daily. 30 tablet 0  . norgestimate-ethinyl estradiol (ORTHO-CYCLEN,SPRINTEC,PREVIFEM) 0.25-35 MG-MCG tablet Take 1 tablet by mouth daily. Needs office visit 84 tablet  4  . sertraline (ZOLOFT) 25 MG tablet Take 25 mg by mouth daily.     No current facility-administered medications on file prior to visit.   Allergies  Allergen Reactions  . Augmentin [Amoxicillin-Pot Clavulanate] Diarrhea   Review of Systems  Constitutional: Negative for fever and chills.  Cardiovascular: Positive for chest pain. Negative for palpitations and leg swelling.  Gastrointestinal: Negative for diarrhea.  Musculoskeletal: Positive for arthralgias (left).  Psychiatric/Behavioral: Positive for sleep disturbance.   BP 118/70 mmHg  Pulse 54  Temp(Src) 97.7 F (36.5 C) (Oral)  Resp 16  Ht 5' 9.75" (1.772 m)  Wt 190 lb 12.8 oz (86.546 kg)  BMI 27.56 kg/m2  SpO2 98%  LMP 05/25/2014  Objective:  Physical Exam  Constitutional: She is oriented to person, place, and time. She appears well-developed and well-nourished. No distress.  HENT:  Head: Normocephalic and atraumatic.  Eyes: Conjunctivae and EOM are normal. Pupils are equal, round, and reactive to light.  Neck: Neck supple. No tracheal deviation present.  Cardiovascular: Normal rate, regular rhythm, S1 normal, S2 normal and normal heart sounds.   No murmur heard. Pulmonary/Chest: Effort normal and breath sounds normal. No respiratory distress. She has no wheezes. She has no rales. She exhibits no tenderness.  Abdominal: Soft. Bowel sounds are normal. There is no tenderness.  Musculoskeletal: Normal range of motion.  Neurological: She is alert and oriented to person, place, and time.  Skin:  Skin is warm and dry.  Psychiatric: She has a normal mood and affect. Her behavior is normal.  Nursing note and vitals reviewed.  Assessment & Plan:   Leukocytosis - Plan: POCT CBC, US Abdomen Limited RUQ - resolved  Sternocostal pain improving, cont meloxicam until completely resolved - if still having any pain in 1 mo, RTC for further eval.  Nausea without vomiting - Plan: Comprehensive metabolic panel, US Abdomen Limited  RUQ - pt concerned that gallstones may be cause of pain so check [place].  Meds ordered this encounter  Medications  . meloxicam (MOBIC) 15 MG tablet    Sig: Take 1 tablet (15 mg total) by mouth daily.    Dispense:  30 tablet    Refill:  0    I personally performed the services described in this documentation, which was scribed in my presence. The recorded information has been reviewed and considered, and addended by me as needed.  Norberto Sorenson, MD MPH  Results for orders placed or performed in visit on 06/11/14  Comprehensive metabolic panel  Result Value Ref Range   Sodium 139 135 - 145 mEq/L   Potassium 4.3 3.5 - 5.3 mEq/L   Chloride 104 96 - 112 mEq/L   CO2 26 19 - 32 mEq/L   Glucose, Bld 88 70 - 99 mg/dL   BUN 14 6 - 23 mg/dL   Creat 9.52 8.41 - 3.24 mg/dL   Total Bilirubin 0.5 0.2 - 1.2 mg/dL   Alkaline Phosphatase 47 39 - 117 U/L   AST 14 0 - 37 U/L   ALT 14 0 - 35 U/L   Total Protein 6.7 6.0 - 8.3 g/dL   Albumin 3.8 3.5 - 5.2 g/dL   Calcium 9.0 8.4 - 40.1 mg/dL  POCT CBC  Result Value Ref Range   WBC 9.7 4.6 - 10.2 K/uL   Lymph, poc 2.8 0.6 - 3.4   POC LYMPH PERCENT 28.8 10 - 50 %L   MID (cbc) 0.5 0 - 0.9   POC MID % 5.2 0 - 12 %M   POC Granulocyte 6.4 2 - 6.9   Granulocyte percent 66.0 37 - 80 %G   RBC 4.57 4.04 - 5.48 M/uL   Hemoglobin 13.4 12.2 - 16.2 g/dL   HCT, POC 02.7 25.3 - 47.9 %   MCV 90.9 80 - 97 fL   MCH, POC 29.4 27 - 31.2 pg   MCHC 32.3 31.8 - 35.4 g/dL   RDW, POC 66.4 %   Platelet Count, POC 190 142 - 424 K/uL   MPV 7.6 0 - 99.8 fL

## 2014-06-14 ENCOUNTER — Encounter: Payer: Self-pay | Admitting: Family Medicine

## 2015-07-09 ENCOUNTER — Other Ambulatory Visit: Payer: Self-pay | Admitting: Family Medicine

## 2015-07-10 NOTE — Telephone Encounter (Signed)
Patient states she is no longer taking this birthcontrol .

## 2016-08-17 IMAGING — CR DG WRIST COMPLETE 3+V*L*
4 series · 4 of 4 positions shown · non-contrast
Comparison: 11/10/2011.

CLINICAL DATA: 24-year-old female with remote injury to the left
wrist rib crying surgery. Re- injury 1 month ago with pain and
swelling. Initial encounter.

EXAM:
LEFT WRIST - COMPLETE 3+ VIEW

[PA]
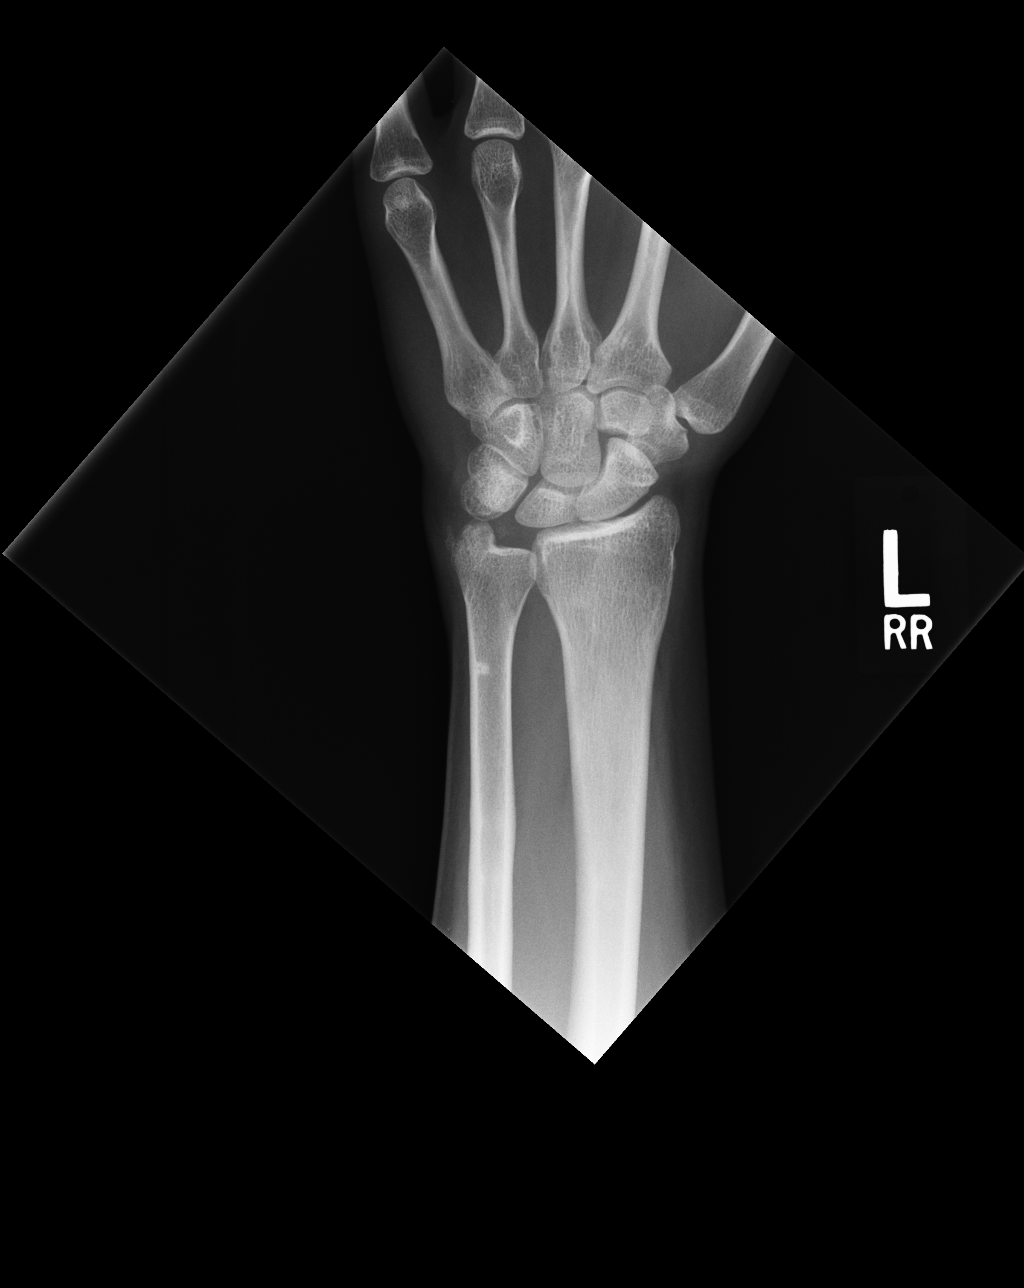

[pa int rot]
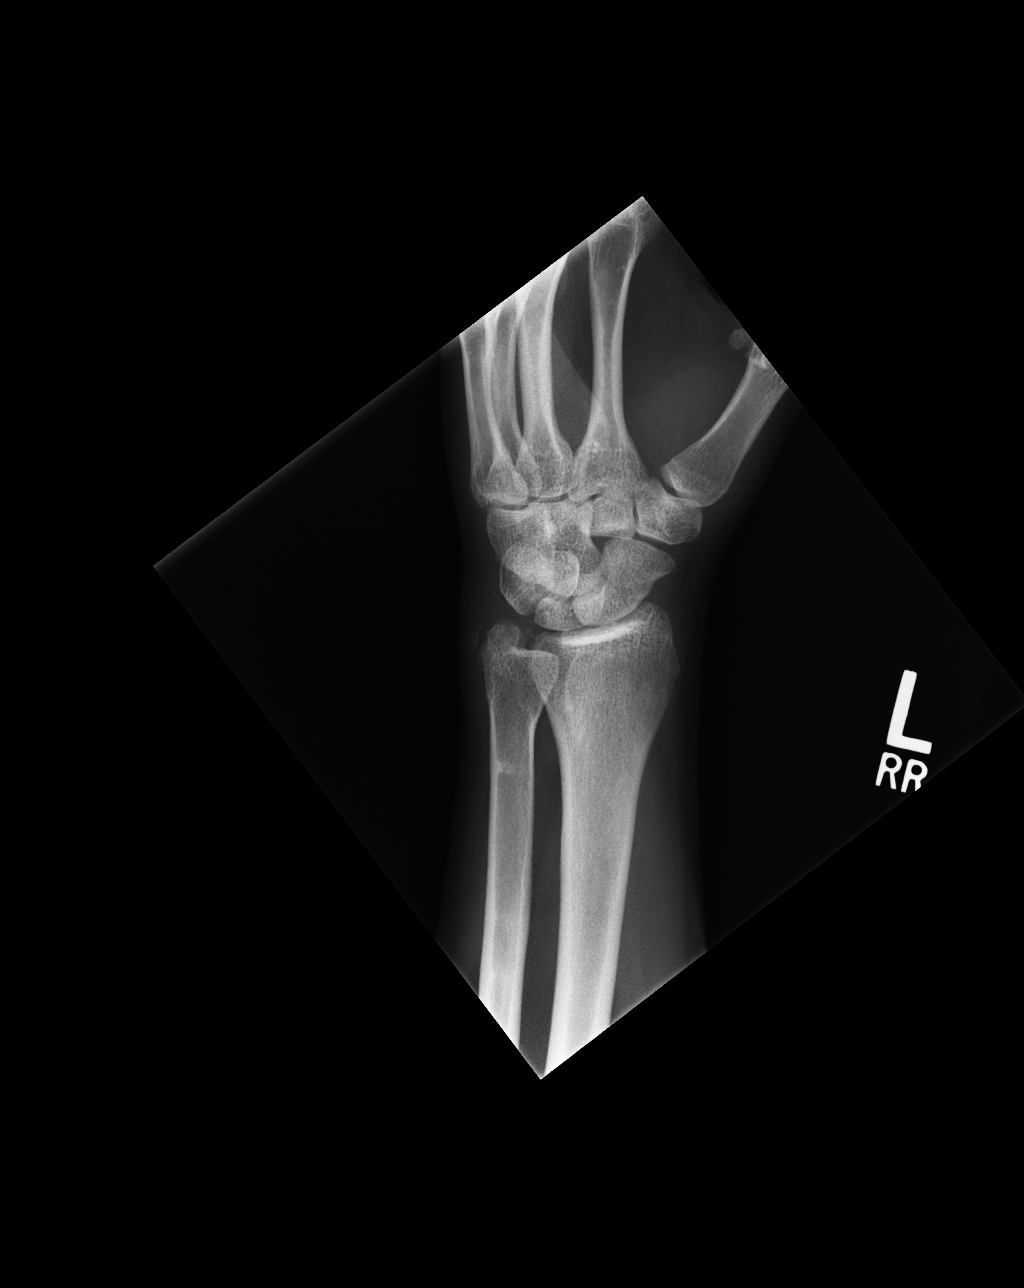

[lateral]
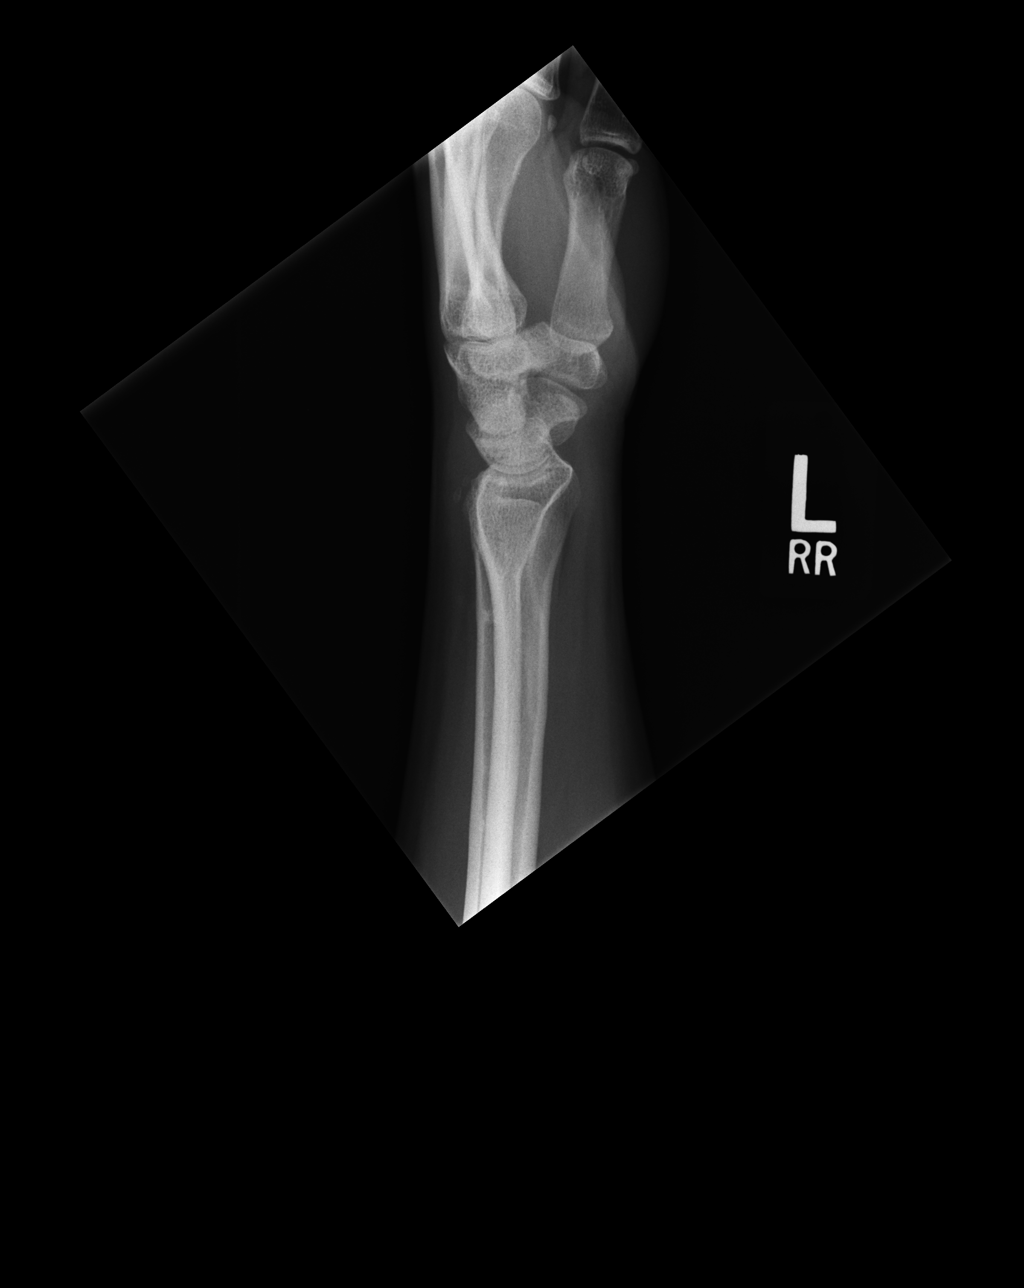

[ap ext rot]
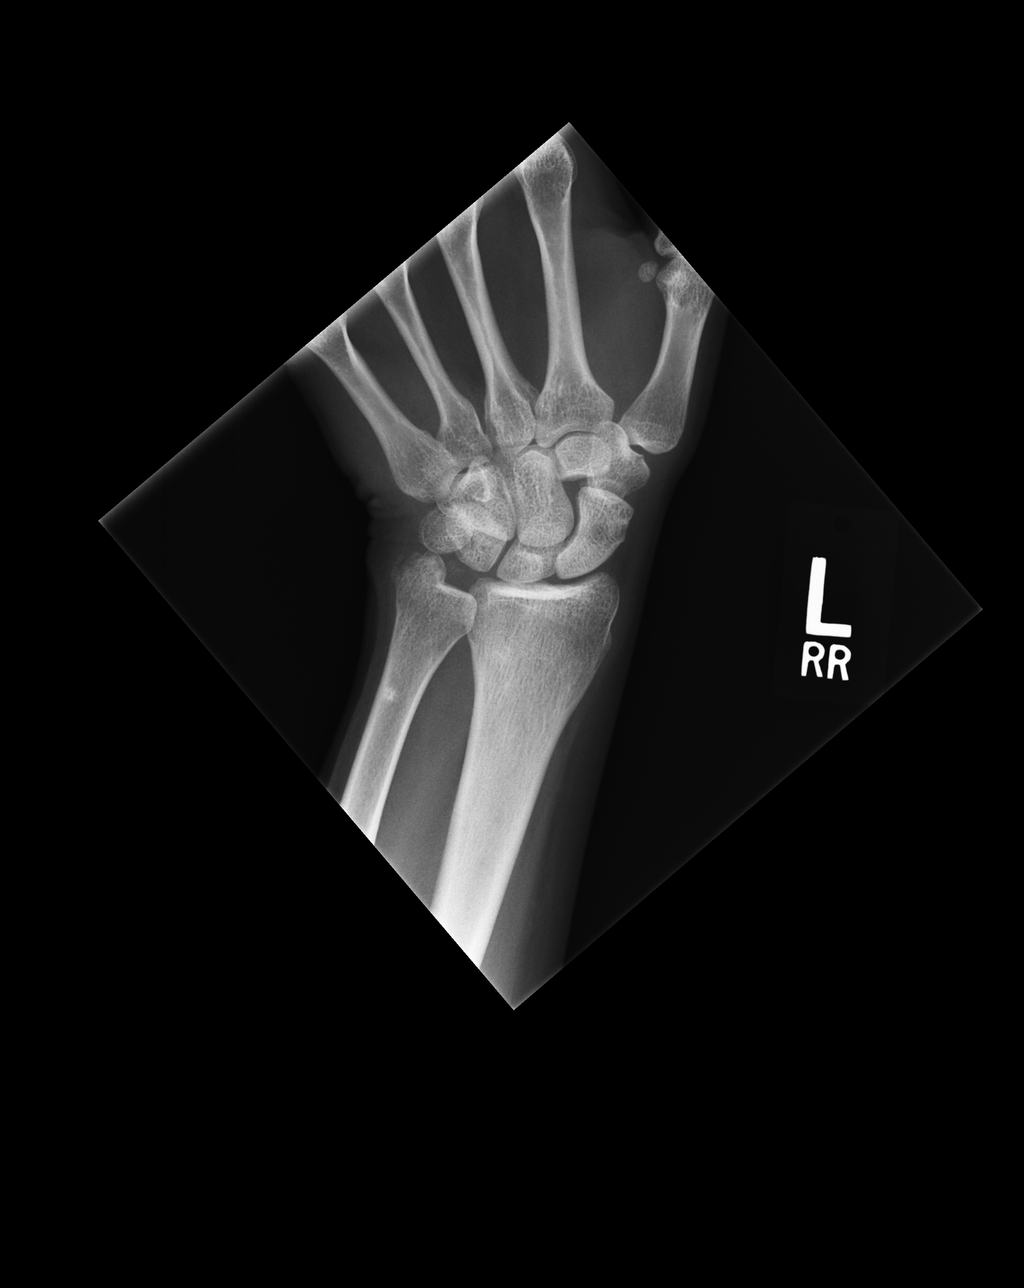

[4 of 4 positions shown; findings below may reference images not displayed]

FINDINGS: Stable chronic changes to the distal ulna and ulnar styloid. No
superimposed acute ulna fracture identified. Mild chondrocalcinosis
along the ulnar aspect of the wrist suspected. Distal left radius
intact. There is radiocarpal joint space loss with radius
subchondral sclerosis. Carpal bone alignment within normal limits.
Carpal joint spaces preserved. Visible metacarpals intact.
IMPRESSION: Chronic posttraumatic and degenerative changes to the left wrist. No
acute osseous abnormality identified.
